# Patient Record
Sex: Male | Born: 1970 | Race: Black or African American | Hispanic: No | Marital: Single | Smoking: Current every day smoker
Health system: Southern US, Community
[De-identification: ages and names within clinical notes are randomized; demographics above are authoritative.]

## PROBLEM LIST (undated history)

## (undated) DIAGNOSIS — F1020 Alcohol dependence, uncomplicated: Secondary | ICD-10-CM

---

## 2003-02-14 ENCOUNTER — Emergency Department (HOSPITAL_COMMUNITY): Admission: EM | Admit: 2003-02-14 | Discharge: 2003-02-14 | Payer: Self-pay | Admitting: Emergency Medicine

## 2006-12-13 ENCOUNTER — Emergency Department (HOSPITAL_COMMUNITY): Admission: EM | Admit: 2006-12-13 | Discharge: 2006-12-13 | Payer: Self-pay | Admitting: Emergency Medicine

## 2008-12-11 ENCOUNTER — Emergency Department (HOSPITAL_COMMUNITY): Admission: EM | Admit: 2008-12-11 | Discharge: 2008-12-11 | Payer: Self-pay | Admitting: Emergency Medicine

## 2011-03-15 LAB — CULTURE, ROUTINE-ABSCESS

## 2012-01-09 ENCOUNTER — Encounter (HOSPITAL_COMMUNITY): Payer: Self-pay | Admitting: Emergency Medicine

## 2012-01-09 ENCOUNTER — Emergency Department (INDEPENDENT_AMBULATORY_CARE_PROVIDER_SITE_OTHER)
Admission: EM | Admit: 2012-01-09 | Discharge: 2012-01-09 | Disposition: A | Discharge status: Home / Self Care | Payer: Self-pay | Source: Home / Self Care

## 2012-01-09 DIAGNOSIS — IMO0001 Reserved for inherently not codable concepts without codable children: Secondary | ICD-10-CM

## 2012-01-09 DIAGNOSIS — M791 Myalgia, unspecified site: Secondary | ICD-10-CM

## 2012-01-09 MED ORDER — CYCLOBENZAPRINE HCL 5 MG PO TABS: 10.0000 mg | ORAL_TABLET | Freq: Three times a day (TID) | ORAL | Status: AC | PRN

## 2012-01-09 MED ORDER — NAPROXEN 500 MG PO TABS: 500.0000 mg | ORAL_TABLET | Freq: Two times a day (BID) | ORAL | Status: DC
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
## 2012-01-12 NOTE — ED Provider Notes (Signed)
Medical screening examination/treatment/procedure(s) were performed by non-physician practitioner and as supervising physician I was immediately available for consultation/collaboration.  Luiz Blare MD   Luiz Blare, MD 01/12/12 [phone]

## 2012-01-13 ENCOUNTER — Emergency Department (HOSPITAL_COMMUNITY)
Admission: EM | Admit: 2012-01-13 | Discharge: 2012-01-13 | Disposition: A | Discharge status: Home / Self Care | Payer: No Typology Code available for payment source | Attending: Emergency Medicine | Admitting: Emergency Medicine

## 2012-01-13 ENCOUNTER — Encounter (HOSPITAL_COMMUNITY): Payer: Self-pay | Admitting: Emergency Medicine

## 2012-01-13 DIAGNOSIS — F17200 Nicotine dependence, unspecified, uncomplicated: Secondary | ICD-10-CM | POA: Insufficient documentation

## 2012-01-13 DIAGNOSIS — K649 Unspecified hemorrhoids: Secondary | ICD-10-CM | POA: Insufficient documentation

## 2012-01-13 DIAGNOSIS — M549 Dorsalgia, unspecified: Secondary | ICD-10-CM | POA: Insufficient documentation

## 2012-01-13 MED ORDER — DIAZEPAM 5 MG PO TABS: 5.0000 mg | ORAL_TABLET | Freq: Three times a day (TID) | ORAL | Status: AC | PRN

## 2012-01-13 MED ORDER — HYDROCORTISONE 2.5 % RE CREA: TOPICAL_CREAM | RECTAL | Status: AC

## 2012-01-13 MED ORDER — NAPROXEN 500 MG PO TABS: 500.0000 mg | ORAL_TABLET | Freq: Two times a day (BID) | ORAL | Status: DC | PRN

## 2012-01-13 MED ORDER — DOCUSATE SODIUM 100 MG PO CAPS: 100.0000 mg | ORAL_CAPSULE | Freq: Two times a day (BID) | ORAL | Status: AC
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
## 2012-04-18 ENCOUNTER — Encounter (HOSPITAL_COMMUNITY): Payer: Self-pay | Admitting: Emergency Medicine

## 2012-04-18 ENCOUNTER — Emergency Department (HOSPITAL_COMMUNITY)
Admission: EM | Admit: 2012-04-18 | Discharge: 2012-04-19 | Disposition: A | Discharge status: Home / Self Care | Payer: Self-pay | Attending: Emergency Medicine | Admitting: Emergency Medicine

## 2012-04-18 DIAGNOSIS — K649 Unspecified hemorrhoids: Secondary | ICD-10-CM | POA: Insufficient documentation

## 2012-04-18 DIAGNOSIS — K6289 Other specified diseases of anus and rectum: Secondary | ICD-10-CM | POA: Insufficient documentation

## 2012-04-18 DIAGNOSIS — F17200 Nicotine dependence, unspecified, uncomplicated: Secondary | ICD-10-CM | POA: Insufficient documentation

## 2012-04-18 DIAGNOSIS — R300 Dysuria: Secondary | ICD-10-CM | POA: Insufficient documentation

## 2012-04-18 DIAGNOSIS — Z202 Contact with and (suspected) exposure to infections with a predominantly sexual mode of transmission: Secondary | ICD-10-CM

## 2012-04-18 DIAGNOSIS — R109 Unspecified abdominal pain: Secondary | ICD-10-CM | POA: Insufficient documentation

## 2012-04-18 NOTE — ED Notes (Signed)
Pt reports for a few weeks having groin pain and hemorrhoid pain; denies abnormal discharge

## 2012-04-19 LAB — URINALYSIS, ROUTINE W REFLEX MICROSCOPIC
Ketones, ur: NEGATIVE mg/dL
Leukocytes, UA: NEGATIVE
Nitrite: NEGATIVE
Protein, ur: NEGATIVE mg/dL
Urobilinogen, UA: 1 mg/dL (ref 0.0–1.0)
pH: 6 (ref 5.0–8.0)

## 2012-04-19 MED ORDER — CEFTRIAXONE SODIUM 250 MG IJ SOLR
250.0000 mg | Freq: Once | INTRAMUSCULAR | Status: AC
  Administered 2012-04-19: 250 mg via INTRAMUSCULAR
  Filled 2012-04-19: qty 250

## 2012-04-19 MED ORDER — AZITHROMYCIN 250 MG PO TABS
1000.0000 mg | ORAL_TABLET | Freq: Once | ORAL | Status: AC
  Administered 2012-04-19: 1000 mg via ORAL
  Filled 2012-04-19: qty 4

## 2012-04-19 MED ORDER — METRONIDAZOLE 500 MG PO TABS
2000.0000 mg | ORAL_TABLET | Freq: Once | ORAL | Status: AC
  Administered 2012-04-19: 2000 mg via ORAL
  Filled 2012-04-19: qty 4

## 2012-04-19 MED ORDER — LIDOCAINE HCL (PF) 1 % IJ SOLN
INTRAMUSCULAR | Status: AC
  Administered 2012-04-19: 0.9 mL
  Filled 2012-04-19: qty 5
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
## 2012-04-19 NOTE — ED Notes (Signed)
Patient is alert and orientedx4.  Patient was explained discharge instructions and he did not have any questions. 

## 2012-04-19 NOTE — ED Notes (Signed)
Patient is resting comfortably. 

## 2012-04-19 NOTE — ED Provider Notes (Signed)
Medical screening examination/treatment/procedure(s) were performed by non-physician practitioner and as supervising physician I was immediately available for consultation/collaboration.  Johncarlo Maalouf Lytle Michaels, MD 04/19/12 [phone]

## 2012-04-19 NOTE — ED Notes (Signed)
Patient says he was having the pain in his rectum and groin for two weeks and has been progressing.  Patient says he came in because the pain has gotten worse.

## 2012-04-19 NOTE — ED Notes (Signed)
I gave the patient a cup of ice water. 

## 2014-12-02 ENCOUNTER — Emergency Department (HOSPITAL_COMMUNITY): Payer: Self-pay

## 2014-12-02 ENCOUNTER — Encounter (HOSPITAL_COMMUNITY): Payer: Self-pay | Admitting: *Deleted

## 2014-12-02 ENCOUNTER — Emergency Department (HOSPITAL_COMMUNITY)
Admission: EM | Admit: 2014-12-02 | Discharge: 2014-12-02 | Disposition: A | Discharge status: Home / Self Care | Payer: Self-pay | Attending: Emergency Medicine | Admitting: Emergency Medicine

## 2014-12-02 DIAGNOSIS — W3400XA Accidental discharge from unspecified firearms or gun, initial encounter: Secondary | ICD-10-CM

## 2014-12-02 DIAGNOSIS — Y9289 Other specified places as the place of occurrence of the external cause: Secondary | ICD-10-CM | POA: Insufficient documentation

## 2014-12-02 DIAGNOSIS — Y998 Other external cause status: Secondary | ICD-10-CM | POA: Insufficient documentation

## 2014-12-02 DIAGNOSIS — Z720 Tobacco use: Secondary | ICD-10-CM | POA: Insufficient documentation

## 2014-12-02 DIAGNOSIS — Y9389 Activity, other specified: Secondary | ICD-10-CM | POA: Insufficient documentation

## 2014-12-02 DIAGNOSIS — S81802A Unspecified open wound, left lower leg, initial encounter: Secondary | ICD-10-CM | POA: Insufficient documentation

## 2014-12-02 LAB — COMPREHENSIVE METABOLIC PANEL
ALBUMIN: 3.7 g/dL (ref 3.5–5.0)
ALT: 87 U/L — AB (ref 17–63)
AST: 81 U/L — ABNORMAL HIGH (ref 15–41)
Alkaline Phosphatase: 56 U/L (ref 38–126)
Anion gap: 11 (ref 5–15)
BILIRUBIN TOTAL: 0.8 mg/dL (ref 0.3–1.2)
BUN: 8 mg/dL (ref 6–20)
CALCIUM: 8.6 mg/dL — AB (ref 8.9–10.3)
CO2: 26 mmol/L (ref 22–32)
Chloride: 105 mmol/L (ref 101–111)
Creatinine, Ser: 1.46 mg/dL — ABNORMAL HIGH (ref 0.61–1.24)
GFR calc Af Amer: >60 mL/min (ref >60)
GFR calc non Af Amer: 58 mL/min — ABNORMAL LOW (ref >60)
GLUCOSE: 113 mg/dL — AB (ref 65–99)
Potassium: 4.1 mmol/L (ref 3.5–5.1)
SODIUM: 142 mmol/L (ref 135–145)
Total Protein: 6.5 g/dL (ref 6.5–8.1)

## 2014-12-02 LAB — CBC
HEMATOCRIT: 38.9 % — AB (ref 39.0–52.0)
Hemoglobin: 13.8 g/dL (ref 13.0–17.0)
MCH: 36 pg — AB (ref 26.0–34.0)
MCHC: 35.5 g/dL (ref 30.0–36.0)
MCV: 101.6 fL — AB (ref 78.0–100.0)
Platelets: 202 10*3/uL (ref 150–400)
RBC: 3.83 MIL/uL — ABNORMAL LOW (ref 4.22–5.81)
RDW: 14.1 % (ref 11.5–15.5)
WBC: 4 10*3/uL (ref 4.0–10.5)

## 2014-12-02 LAB — ETHANOL: Alcohol, Ethyl (B): 224 mg/dL — ABNORMAL HIGH (ref <5)

## 2014-12-02 MED ORDER — SODIUM CHLORIDE 0.9 % IV BOLUS (SEPSIS)
1000.0000 mL | Freq: Once | INTRAVENOUS | Status: AC
  Administered 2014-12-02: 1000 mL via INTRAVENOUS

## 2014-12-02 MED ORDER — OXYCODONE-ACETAMINOPHEN 5-325 MG PO TABS
2.0000 | ORAL_TABLET | Freq: Once | ORAL | Status: AC
  Administered 2014-12-02: 2 via ORAL
  Filled 2014-12-02: qty 2

## 2014-12-02 MED ORDER — HYDROCODONE-ACETAMINOPHEN 5-325 MG PO TABS: 1.0000 | ORAL_TABLET | Freq: Four times a day (QID) | ORAL | Status: DC | PRN

## 2014-12-02 NOTE — ED Notes (Signed)
Pt off unit with xray 
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
## 2014-12-02 NOTE — ED Notes (Addendum)
Pt returned from xray, [employer] at bedside, pain medication given.  Pt belongings placed in a paperbag.

## 2014-12-02 NOTE — ED Notes (Signed)
CSI at bedside.

## 2014-12-02 NOTE — Care Management (Addendum)
ED CM spoke with patient at bedside regarding follow up care. Discussed the follow up care patient states, he does not have a PCP. [facility], patient is agreeable to establsihing care and [facility]. Follow up appt scheduled for 7/11 at 1230p  clinic information given to patient,and appt information placed on AVS. Updated J. Pieprenbink PA-C. No further ED CM needs identified.

## 2014-12-09 ENCOUNTER — Encounter: Payer: Self-pay | Admitting: Family Medicine

## 2014-12-09 ENCOUNTER — Ambulatory Visit: Payer: Self-pay | Attending: Family Medicine | Admitting: Family Medicine

## 2014-12-09 VITALS — BP 107/73 | HR 78 | Temp 98.6°F | Resp 18 | Ht 71.0 in | Wt 181.6 lb

## 2014-12-09 DIAGNOSIS — S81832A Puncture wound without foreign body, left lower leg, initial encounter: Secondary | ICD-10-CM | POA: Insufficient documentation

## 2014-12-09 DIAGNOSIS — W3400XA Accidental discharge from unspecified firearms or gun, initial encounter: Secondary | ICD-10-CM

## 2014-12-09 DIAGNOSIS — R748 Abnormal levels of other serum enzymes: Secondary | ICD-10-CM

## 2014-12-09 DIAGNOSIS — S81802A Unspecified open wound, left lower leg, initial encounter: Secondary | ICD-10-CM

## 2014-12-09 LAB — COMPREHENSIVE METABOLIC PANEL
ALBUMIN: 4 g/dL (ref 3.5–5.2)
ALK PHOS: 57 U/L (ref 39–117)
ALT: 82 U/L — AB (ref 0–53)
AST: 49 U/L — ABNORMAL HIGH (ref 0–37)
BILIRUBIN TOTAL: 0.9 mg/dL (ref 0.2–1.2)
BUN: 14 mg/dL (ref 6–23)
CHLORIDE: 103 meq/L (ref 96–112)
CO2: 27 meq/L (ref 19–32)
Calcium: 9.3 mg/dL (ref 8.4–10.5)
Creat: 1.17 mg/dL (ref 0.50–1.35)
GLUCOSE: 92 mg/dL (ref 70–99)
Potassium: 4.7 mEq/L (ref 3.5–5.3)
SODIUM: 139 meq/L (ref 135–145)
Total Protein: 6.9 g/dL (ref 6.0–8.3)
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
## 2014-12-10 ENCOUNTER — Telehealth: Payer: Self-pay | Admitting: *Deleted

## 2014-12-10 LAB — HEPATITIS PANEL, ACUTE
HCV AB: NEGATIVE
Hep A IgM: NONREACTIVE
Hep B C IgM: NONREACTIVE
Hepatitis B Surface Ag: NEGATIVE

## 2014-12-10 NOTE — Telephone Encounter (Signed)
-----   Message from Jaclyn ShaggyEnobong Amao, MD sent at 12/10/2014  8:19 AM EDT ----- LFTs are trending down, labs are negative for Hepatitis.

## 2014-12-10 NOTE — Telephone Encounter (Signed)
Left HIPAA compliant message for patient to return call. 

## 2014-12-17 ENCOUNTER — Ambulatory Visit: Payer: Self-pay | Attending: Family Medicine | Admitting: Family Medicine

## 2014-12-17 ENCOUNTER — Encounter: Payer: Self-pay | Admitting: Family Medicine

## 2014-12-17 VITALS — BP 123/81 | HR 75 | Temp 98.5°F | Resp 16 | Ht 71.0 in | Wt 181.0 lb

## 2014-12-17 DIAGNOSIS — Z720 Tobacco use: Secondary | ICD-10-CM | POA: Insufficient documentation

## 2014-12-17 DIAGNOSIS — S81802D Unspecified open wound, left lower leg, subsequent encounter: Secondary | ICD-10-CM | POA: Insufficient documentation

## 2014-12-17 DIAGNOSIS — R748 Abnormal levels of other serum enzymes: Secondary | ICD-10-CM | POA: Insufficient documentation

## 2014-12-17 DIAGNOSIS — W3400XD Accidental discharge from unspecified firearms or gun, subsequent encounter: Secondary | ICD-10-CM | POA: Insufficient documentation

## 2014-12-17 DIAGNOSIS — S81832D Puncture wound without foreign body, left lower leg, subsequent encounter: Secondary | ICD-10-CM
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
## 2014-12-17 NOTE — Patient Instructions (Signed)
Smoking Cessation Quitting smoking is important to your health and has many advantages. However, it is not always easy to quit since nicotine is a very addictive drug. Oftentimes, people try 3 times or more before being able to quit. This document explains the best ways for you to prepare to quit smoking. Quitting takes hard work and a lot of effort, but you can do it. ADVANTAGES OF QUITTING SMOKING  You will live longer, feel better, and live better.  Your body will feel the impact of quitting smoking almost immediately.  Within 20 minutes, blood pressure decreases. Your pulse returns to its normal level.  After 8 hours, carbon monoxide levels in the blood return to normal. Your oxygen level increases.  After 24 hours, the chance of having a heart attack starts to decrease. Your breath, hair, and body stop smelling like smoke.  After 48 hours, damaged nerve endings begin to recover. Your sense of taste and smell improve.  After 72 hours, the body is virtually free of nicotine. Your bronchial tubes relax and breathing becomes easier.  After 2 to 12 weeks, lungs can hold more air. Exercise becomes easier and circulation improves.  The risk of having a heart attack, stroke, cancer, or lung disease is greatly reduced.  After 1 year, the risk of coronary heart disease is cut in half.  After 5 years, the risk of stroke falls to the same as a nonsmoker.  After 10 years, the risk of lung cancer is cut in half and the risk of other cancers decreases significantly.  After 15 years, the risk of coronary heart disease drops, usually to the level of a nonsmoker.  If you are pregnant, quitting smoking will improve your chances of having a healthy baby.  The people you live with, especially any children, will be healthier.  You will have extra money to spend on things other than cigarettes. QUESTIONS TO THINK ABOUT BEFORE ATTEMPTING TO QUIT You may want to talk about your answers with your  health care provider.  Why do you want to quit?  If you tried to quit in the past, what helped and what did not?  What will be the most difficult situations for you after you quit? How will you plan to handle them?  Who can help you through the tough times? Your family? Friends? A health care provider?  What pleasures do you get from smoking? What ways can you still get pleasure if you quit? Here are some questions to ask your health care provider:  How can you help me to be successful at quitting?  What medicine do you think would be best for me and how should I take it?  What should I do if I need more help?  What is smoking withdrawal like? How can I get information on withdrawal? GET READY  Set a quit date.  Change your environment by getting rid of all cigarettes, ashtrays, matches, and lighters in your home, car, or work. Do not let people smoke in your home.  Review your past attempts to quit. Think about what worked and what did not. GET SUPPORT AND ENCOURAGEMENT You have a better chance of being successful if you have help. You can get support in many ways.  Tell your family, friends, and coworkers that you are going to quit and need their support. Ask them not to smoke around you.  Get individual, group, or telephone counseling and support. Programs are available at local hospitals and health centers. Call   your local health department for information about programs in your area.  Spiritual beliefs and practices may help some smokers quit.  Download a "quit meter" on your computer to keep track of quit statistics, such as how long you have gone without smoking, cigarettes not smoked, and money saved.  Get a self-help book about quitting smoking and staying off tobacco. LEARN NEW SKILLS AND BEHAVIORS  Distract yourself from urges to smoke. Talk to someone, go for a walk, or occupy your time with a task.  Change your normal routine. Take a different route to work.  Drink tea instead of coffee. Eat breakfast in a different place.  Reduce your stress. Take a hot bath, exercise, or read a book.  Plan something enjoyable to do every day. Reward yourself for not smoking.  Explore interactive web-based programs that specialize in helping you quit. GET MEDICINE AND USE IT CORRECTLY Medicines can help you stop smoking and decrease the urge to smoke. Combining medicine with the above behavioral methods and support can greatly increase your chances of successfully quitting smoking.  Nicotine replacement therapy helps deliver nicotine to your body without the negative effects and risks of smoking. Nicotine replacement therapy includes nicotine gum, lozenges, inhalers, nasal sprays, and skin patches. Some may be available over-the-counter and others require a prescription.  Antidepressant medicine helps people abstain from smoking, but how this works is unknown. This medicine is available by prescription.  Nicotinic receptor partial agonist medicine simulates the effect of nicotine in your brain. This medicine is available by prescription. Ask your health care provider for advice about which medicines to use and how to use them based on your health history. Your health care provider will tell you what side effects to look out for if you choose to be on a medicine or therapy. Carefully read the information on the package. Do not use any other product containing nicotine while using a nicotine replacement product.  RELAPSE OR DIFFICULT SITUATIONS Most relapses occur within the first 3 months after quitting. Do not be discouraged if you start smoking again. Remember, most people try several times before finally quitting. You may have symptoms of withdrawal because your body is used to nicotine. You may crave cigarettes, be irritable, feel very hungry, cough often, get headaches, or have difficulty concentrating. The withdrawal symptoms are only temporary. They are strongest  when you first quit, but they will go away within 10-14 days. To reduce the chances of relapse, try to:  Avoid drinking alcohol. Drinking lowers your chances of successfully quitting.  Reduce the amount of caffeine you consume. Once you quit smoking, the amount of caffeine in your body increases and can give you symptoms, such as a rapid heartbeat, sweating, and anxiety.  Avoid smokers because they can make you want to smoke.  Do not let weight gain distract you. Many smokers will gain weight when they quit, usually less than 10 pounds. Eat a healthy diet and stay active. You can always lose the weight gained after you quit.  Find ways to improve your mood other than smoking. FOR MORE INFORMATION  www.smokefree.gov  Document Released: 05/11/2001 Document Revised: 10/01/2013 Document Reviewed: 08/26/2011 ExitCare Patient Information 2015 [employer]. This information is not intended to replace advice given to you by your health care provider. Make sure you discuss any questions you have with your health care provider.  

## 2015-01-20 ENCOUNTER — Ambulatory Visit: Payer: Self-pay | Admitting: Family Medicine

## 2015-02-12 ENCOUNTER — Ambulatory Visit: Payer: Self-pay | Admitting: Family Medicine
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
## 2017-02-11 ENCOUNTER — Encounter (HOSPITAL_COMMUNITY): Payer: Self-pay | Admitting: Emergency Medicine

## 2017-02-11 ENCOUNTER — Emergency Department (HOSPITAL_COMMUNITY): Payer: Self-pay

## 2017-02-11 ENCOUNTER — Emergency Department (HOSPITAL_COMMUNITY)
Admission: EM | Admit: 2017-02-11 | Discharge: 2017-02-11 | Disposition: A | Discharge status: Home / Self Care | Payer: Self-pay | Attending: Emergency Medicine | Admitting: Emergency Medicine

## 2017-02-11 DIAGNOSIS — R109 Unspecified abdominal pain: Secondary | ICD-10-CM | POA: Insufficient documentation

## 2017-02-11 DIAGNOSIS — F17210 Nicotine dependence, cigarettes, uncomplicated: Secondary | ICD-10-CM | POA: Insufficient documentation

## 2017-02-11 DIAGNOSIS — M545 Low back pain, unspecified: Secondary | ICD-10-CM

## 2017-02-11 DIAGNOSIS — R748 Abnormal levels of other serum enzymes: Secondary | ICD-10-CM

## 2017-02-11 LAB — URINALYSIS, ROUTINE W REFLEX MICROSCOPIC
Bilirubin Urine: NEGATIVE
Glucose, UA: NEGATIVE mg/dL
Ketones, ur: NEGATIVE mg/dL
Leukocytes, UA: NEGATIVE
Nitrite: NEGATIVE
PROTEIN: NEGATIVE mg/dL
RBC / HPF: NONE SEEN RBC/hpf (ref 0–5)
SQUAMOUS EPITHELIAL / LPF: NONE SEEN
Specific Gravity, Urine: 1.011 (ref 1.005–1.030)
pH: 5 (ref 5.0–8.0)

## 2017-02-11 LAB — CBC
HEMATOCRIT: 41.5 % (ref 39.0–52.0)
Hemoglobin: 14.5 g/dL (ref 13.0–17.0)
MCH: 34.7 pg — ABNORMAL HIGH (ref 26.0–34.0)
MCHC: 34.9 g/dL (ref 30.0–36.0)
MCV: 99.3 fL (ref 78.0–100.0)
PLATELETS: 159 10*3/uL (ref 150–400)
RBC: 4.18 MIL/uL — ABNORMAL LOW (ref 4.22–5.81)
RDW: 14.3 % (ref 11.5–15.5)
WBC: 3.5 10*3/uL — AB (ref 4.0–10.5)

## 2017-02-11 LAB — BASIC METABOLIC PANEL
Anion gap: 9 (ref 5–15)
BUN: 8 mg/dL (ref 6–20)
CALCIUM: 8.6 mg/dL — AB (ref 8.9–10.3)
CO2: 22 mmol/L (ref 22–32)
Chloride: 106 mmol/L (ref 101–111)
Creatinine, Ser: 1.03 mg/dL (ref 0.61–1.24)
GFR calc Af Amer: >60 mL/min (ref >60)
Glucose, Bld: 103 mg/dL — ABNORMAL HIGH (ref 65–99)
POTASSIUM: 3.8 mmol/L (ref 3.5–5.1)
SODIUM: 137 mmol/L (ref 135–145)

## 2017-02-11 MED ORDER — METHOCARBAMOL 500 MG PO TABS: 500.0000 mg | ORAL_TABLET | Freq: Two times a day (BID) | ORAL | 0 refills | Status: DC | PRN
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
## 2017-02-11 NOTE — ED Notes (Signed)
Patient inquired about where the cafeteria was located, this NT instructed patient that our recommendation is that patients do not eat or drink until they see the doctor. Patient left waiting area to go to the cafeteria.

## 2017-02-11 NOTE — ED Notes (Signed)
ED Provider at bedside. 

## 2017-02-11 NOTE — ED Notes (Signed)
Patient transported to CT 

## 2017-02-11 NOTE — Discharge Instructions (Addendum)
Your kidney tests today are not normal. You need to drink plenty of water and have this rechecked by your primary care doctor in the next week. Do not take any NSAIDs (motrin, ibuprofen, advil, aleve, excedrin, aspirin, naproxen, goody's powder,  etc.) because they can further hurt your kidneys.   You can also take  tylenol (acetaminophen)  (this is 3 over the counter pills) four times a day. Do not drink alcohol or combine with other medications that have acetaminophen as an ingredient (Read the labels!).    For breakthrough pain you may take Robaxin. Do not drink alcohol, drive or operate heavy machinery when taking Robaxin.   Do not hesitate to return to the Emergency Department for any new, worsening or concerning symptoms.   If you do not have a primary care doctor you can establish one at the   [facility]: [address] [place] [address] [phone]  After you establish care. Let them know you were seen in the emergency room. They must obtain records for further management.

## 2017-03-22 ENCOUNTER — Encounter (HOSPITAL_COMMUNITY): Payer: Self-pay

## 2017-03-22 ENCOUNTER — Emergency Department (HOSPITAL_COMMUNITY)
Admission: EM | Admit: 2017-03-22 | Discharge: 2017-03-23 | Disposition: A | Discharge status: Home / Self Care | Payer: BLUE CROSS/BLUE SHIELD | Attending: Emergency Medicine | Admitting: Emergency Medicine

## 2017-03-22 DIAGNOSIS — F17210 Nicotine dependence, cigarettes, uncomplicated: Secondary | ICD-10-CM | POA: Diagnosis not present

## 2017-03-22 DIAGNOSIS — G8929 Other chronic pain: Secondary | ICD-10-CM | POA: Diagnosis not present

## 2017-03-22 DIAGNOSIS — M545 Low back pain, unspecified: Secondary | ICD-10-CM

## 2017-03-22 DIAGNOSIS — M549 Dorsalgia, unspecified: Secondary | ICD-10-CM | POA: Insufficient documentation

## 2017-03-22 NOTE — ED Triage Notes (Signed)
Pt endorses bilateral flank pain and lower back pain x 2-3 months since starting a new job. Pt denies urinary sx or neuro sx. VSS. Ambulatory.

## 2017-03-23 NOTE — Discharge Instructions (Signed)
Use Tylenol twice a day. Use the muscle relaxer at night. You may pick up a back brace from a medical supply store to help support your back. Make sure that you are bending at the knees, and not at the waist. Follow-up with [facility] wellness for further evaluation of your back and management of your kidney health. Return to the emergency room if you develop loss of bowel or bladder control, numbness, blood in your urine, or any new or worsening symptoms.
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
## 2017-03-26 ENCOUNTER — Emergency Department (HOSPITAL_COMMUNITY): Payer: BLUE CROSS/BLUE SHIELD

## 2017-03-26 ENCOUNTER — Emergency Department (HOSPITAL_COMMUNITY)
Admission: EM | Admit: 2017-03-26 | Discharge: 2017-03-26 | Disposition: A | Discharge status: Home / Self Care | Payer: BLUE CROSS/BLUE SHIELD | Attending: Emergency Medicine | Admitting: Emergency Medicine

## 2017-03-26 ENCOUNTER — Encounter (HOSPITAL_COMMUNITY): Payer: Self-pay | Admitting: Emergency Medicine

## 2017-03-26 DIAGNOSIS — R1013 Epigastric pain: Secondary | ICD-10-CM | POA: Diagnosis present

## 2017-03-26 DIAGNOSIS — Z79899 Other long term (current) drug therapy: Secondary | ICD-10-CM | POA: Insufficient documentation

## 2017-03-26 DIAGNOSIS — R1011 Right upper quadrant pain: Secondary | ICD-10-CM

## 2017-03-26 DIAGNOSIS — K859 Acute pancreatitis without necrosis or infection, unspecified: Secondary | ICD-10-CM

## 2017-03-26 DIAGNOSIS — F17210 Nicotine dependence, cigarettes, uncomplicated: Secondary | ICD-10-CM | POA: Diagnosis not present

## 2017-03-26 DIAGNOSIS — R0602 Shortness of breath: Secondary | ICD-10-CM | POA: Diagnosis not present

## 2017-03-26 LAB — COMPREHENSIVE METABOLIC PANEL
ALBUMIN: 4.2 g/dL (ref 3.5–5.0)
ALT: 214 U/L — ABNORMAL HIGH (ref 17–63)
ANION GAP: 14 (ref 5–15)
AST: 272 U/L — ABNORMAL HIGH (ref 15–41)
Alkaline Phosphatase: 73 U/L (ref 38–126)
BILIRUBIN TOTAL: 0.7 mg/dL (ref 0.3–1.2)
BUN: 7 mg/dL (ref 6–20)
CO2: 23 mmol/L (ref 22–32)
Calcium: 8.6 mg/dL — ABNORMAL LOW (ref 8.9–10.3)
Chloride: 98 mmol/L — ABNORMAL LOW (ref 101–111)
Creatinine, Ser: 0.93 mg/dL (ref 0.61–1.24)
GFR calc Af Amer: >60 mL/min (ref >60)
GLUCOSE: 93 mg/dL (ref 65–99)
POTASSIUM: 4 mmol/L (ref 3.5–5.1)
Sodium: 135 mmol/L (ref 135–145)
TOTAL PROTEIN: 7.8 g/dL (ref 6.5–8.1)

## 2017-03-26 LAB — URINALYSIS, ROUTINE W REFLEX MICROSCOPIC
BACTERIA UA: NONE SEEN
BILIRUBIN URINE: NEGATIVE
Glucose, UA: NEGATIVE mg/dL
Ketones, ur: NEGATIVE mg/dL
LEUKOCYTES UA: NEGATIVE
NITRITE: NEGATIVE
Protein, ur: NEGATIVE mg/dL
Specific Gravity, Urine: 1.008 (ref 1.005–1.030)
pH: 5 (ref 5.0–8.0)

## 2017-03-26 LAB — CBC
HEMATOCRIT: 41.6 % (ref 39.0–52.0)
HEMOGLOBIN: 15.2 g/dL (ref 13.0–17.0)
MCH: 35.6 pg — ABNORMAL HIGH (ref 26.0–34.0)
MCHC: 36.5 g/dL — ABNORMAL HIGH (ref 30.0–36.0)
MCV: 97.4 fL (ref 78.0–100.0)
Platelets: 148 10*3/uL — ABNORMAL LOW (ref 150–400)
RBC: 4.27 MIL/uL (ref 4.22–5.81)
RDW: 14.9 % (ref 11.5–15.5)
WBC: 4.7 10*3/uL (ref 4.0–10.5)

## 2017-03-26 LAB — LIPASE, BLOOD: LIPASE: 361 U/L — AB (ref 11–51)

## 2017-03-26 MED ORDER — MORPHINE SULFATE 15 MG PO TABS: 15.0000 mg | ORAL_TABLET | ORAL | 0 refills | Status: DC | PRN

## 2017-03-26 MED ORDER — MORPHINE SULFATE (PF) 4 MG/ML IV SOLN
4.0000 mg | Freq: Once | INTRAVENOUS | Status: AC
  Administered 2017-03-26: 4 mg via INTRAVENOUS
  Filled 2017-03-26: qty 1

## 2017-03-26 MED ORDER — ONDANSETRON HCL 4 MG/2ML IJ SOLN
4.0000 mg | Freq: Once | INTRAMUSCULAR | Status: AC
  Administered 2017-03-26: 4 mg via INTRAVENOUS
  Filled 2017-03-26: qty 2

## 2017-03-26 MED ORDER — ONDANSETRON 4 MG PO TBDP: 4.0000 mg | ORAL_TABLET | Freq: Three times a day (TID) | ORAL | 0 refills | Status: DC | PRN

## 2017-03-26 NOTE — Discharge Instructions (Signed)
Clear liquid diet for the next couple of days.

## 2017-03-26 NOTE — ED Notes (Signed)
Patient transported to Ultrasound 

## 2017-03-26 NOTE — ED Triage Notes (Addendum)
C/o intermittent generalized abd pain and sob x 4-5 hours.  Denies nausea, vomiting, diarrhea, and urinary complaint.  Pt states he has drank about 9 cans of beer since yesterday that was expired (2016).
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
## 2017-04-29 ENCOUNTER — Encounter (HOSPITAL_COMMUNITY): Payer: Self-pay | Admitting: Emergency Medicine

## 2017-04-29 ENCOUNTER — Emergency Department (HOSPITAL_COMMUNITY)
Admission: EM | Admit: 2017-04-29 | Discharge: 2017-04-29 | Disposition: A | Discharge status: Home / Self Care | Payer: Self-pay | Attending: Emergency Medicine | Admitting: Emergency Medicine

## 2017-04-29 ENCOUNTER — Emergency Department (HOSPITAL_COMMUNITY): Payer: Self-pay

## 2017-04-29 DIAGNOSIS — S0181XA Laceration without foreign body of other part of head, initial encounter: Secondary | ICD-10-CM | POA: Insufficient documentation

## 2017-04-29 DIAGNOSIS — Z79899 Other long term (current) drug therapy: Secondary | ICD-10-CM | POA: Insufficient documentation

## 2017-04-29 DIAGNOSIS — S0990XA Unspecified injury of head, initial encounter: Secondary | ICD-10-CM | POA: Insufficient documentation

## 2017-04-29 DIAGNOSIS — Y999 Unspecified external cause status: Secondary | ICD-10-CM | POA: Insufficient documentation

## 2017-04-29 DIAGNOSIS — Y929 Unspecified place or not applicable: Secondary | ICD-10-CM | POA: Insufficient documentation

## 2017-04-29 DIAGNOSIS — W19XXXA Unspecified fall, initial encounter: Secondary | ICD-10-CM | POA: Insufficient documentation

## 2017-04-29 DIAGNOSIS — F17210 Nicotine dependence, cigarettes, uncomplicated: Secondary | ICD-10-CM | POA: Insufficient documentation

## 2017-04-29 DIAGNOSIS — Y939 Activity, unspecified: Secondary | ICD-10-CM | POA: Insufficient documentation

## 2017-04-29 HISTORY — DX: Alcohol dependence, uncomplicated: F10.20

## 2017-04-29 MED ORDER — LIDOCAINE-EPINEPHRINE (PF) 2 %-1:200000 IJ SOLN
10.0000 mL | Freq: Once | INTRAMUSCULAR | Status: AC
  Administered 2017-04-29: 10 mL
  Filled 2017-04-29: qty 20

## 2017-04-29 NOTE — ED Notes (Signed)
PA at bedside suturing laceration.  
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
## 2017-04-29 NOTE — ED Triage Notes (Signed)
Patient presents with approx. 1" laceration at right eyelid sustained last night , pt. stated alcohol intoxication and fell , can not recall if he lost consciousness /ambulatory . Alert and oriented .

## 2017-04-29 NOTE — ED Notes (Signed)
Patient transported to CT 

## 2017-04-29 NOTE — Discharge Instructions (Signed)
Please read and follow all provided instructions.  Your diagnoses today include:  1. Mild closed head injury, initial encounter   2. Facial laceration, initial encounter     Tests performed today include:  CT of your brain - no bruising or bleeding in the brain  Vital signs. See below for your results today.   Medications prescribed:   None  Take any prescribed medications only as directed.   Home care instructions:  Follow any educational materials and wound care instructions contained in this packet.   Keep affected area above the level of your heart when possible to minimize swelling. Wash area gently twice a day with warm soapy water. Do not apply alcohol or hydrogen peroxide. Cover the area if it draining or weeping.   Follow-up instructions: Suture Removal: Return to the Emergency Department or see your primary care care doctor in 5 days for a recheck of your wound and removal of your sutures or staples.    Return instructions:  Return to the Emergency Department if you have:  Fever  Worsening pain  Worsening swelling of the wound  Pus draining from the wound  Redness of the skin that moves away from the wound, especially if it streaks away from the affected area   Any other emergent concerns  Your vital signs today were: BP (!) 144/90    Pulse 77    Temp 97.6 F (36.4 C) (Oral)    Resp 16    SpO2 97%  If your blood pressure (BP) was elevated above 135/85 this visit, please have this repeated by your doctor within one month. --------------

## 2018-06-29 IMAGING — US US ABDOMEN LIMITED
1 series · 14 of 25 positions shown · non-contrast
Comparison: CT of the abdomen and pelvis performed 02/11/2017

CLINICAL DATA: Acute onset of right upper quadrant abdominal pain.
Elevated lipase and LFTs. Initial encounter.

EXAM:
ULTRASOUND ABDOMEN LIMITED RIGHT UPPER QUADRANT

[Series 1: us abdomen limited · 0.25mm/px · 14 of 47 slices shown]
[im 1/47]
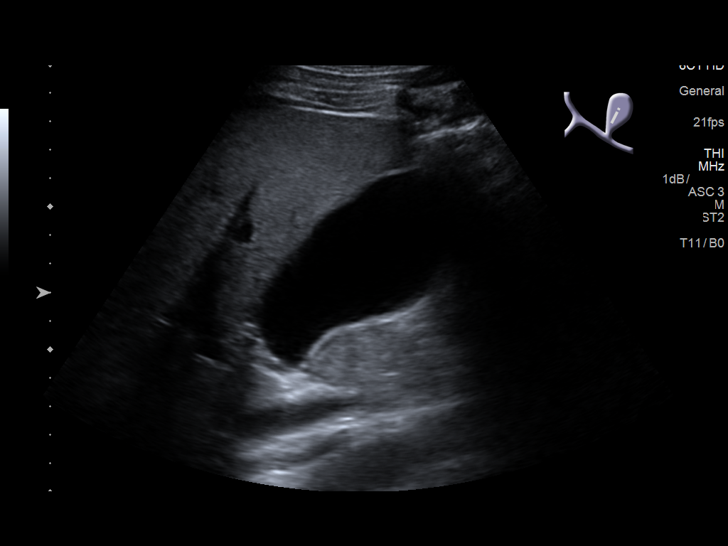
[im 4/47]
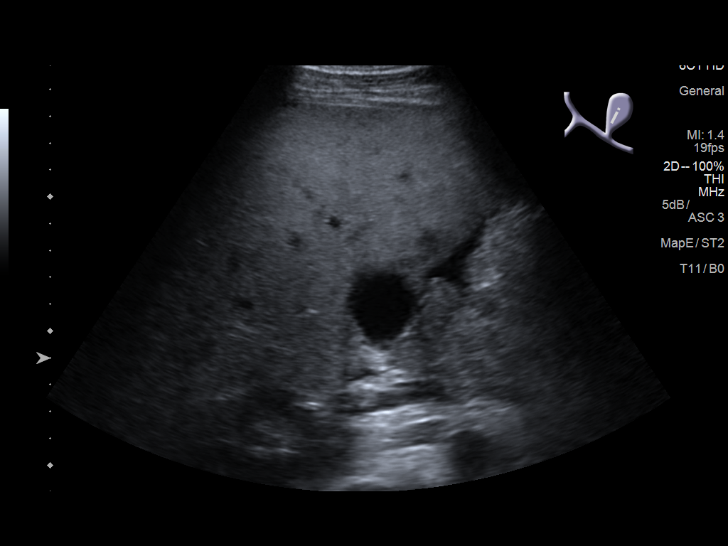
[im 8/47]
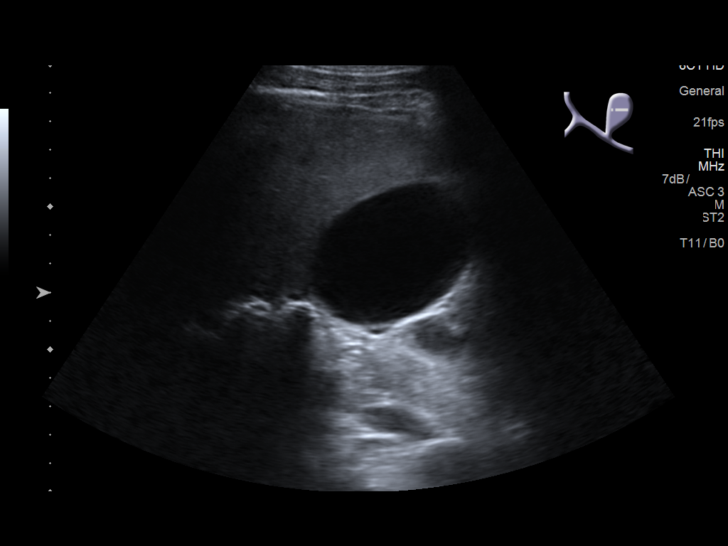
[im 12/47]
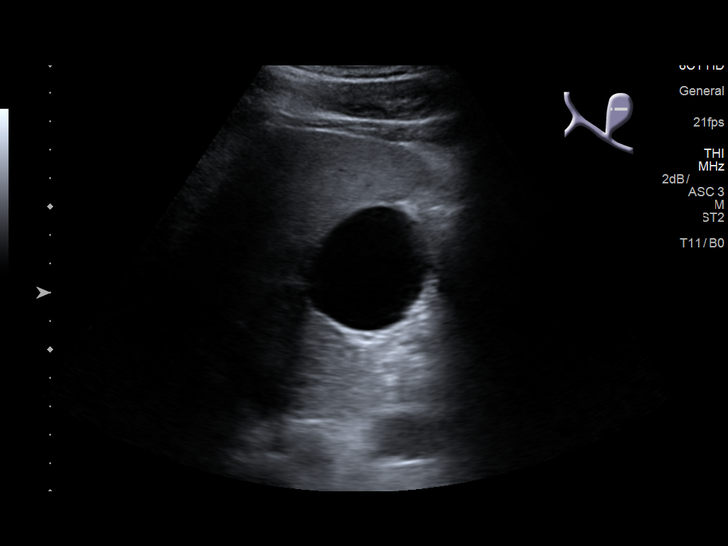
[im 16/47]
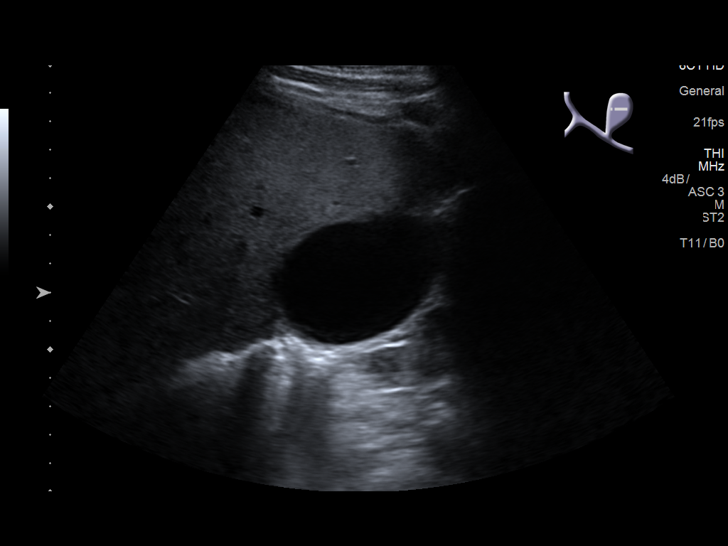
[im 18/47]
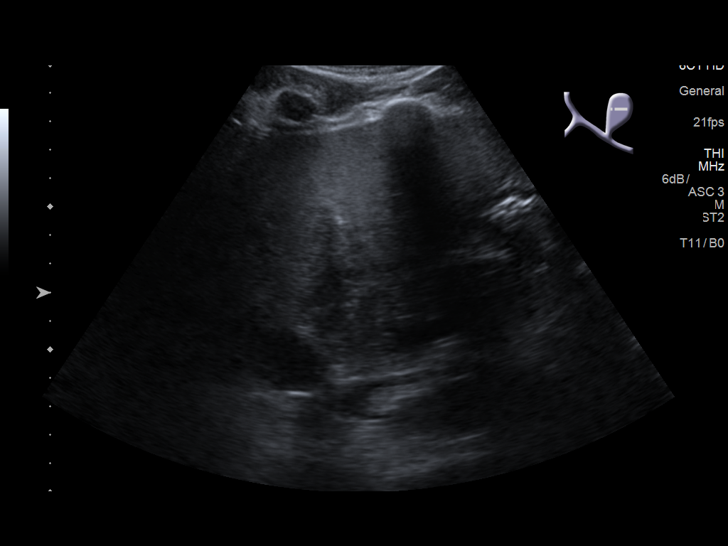
[im 22/47]
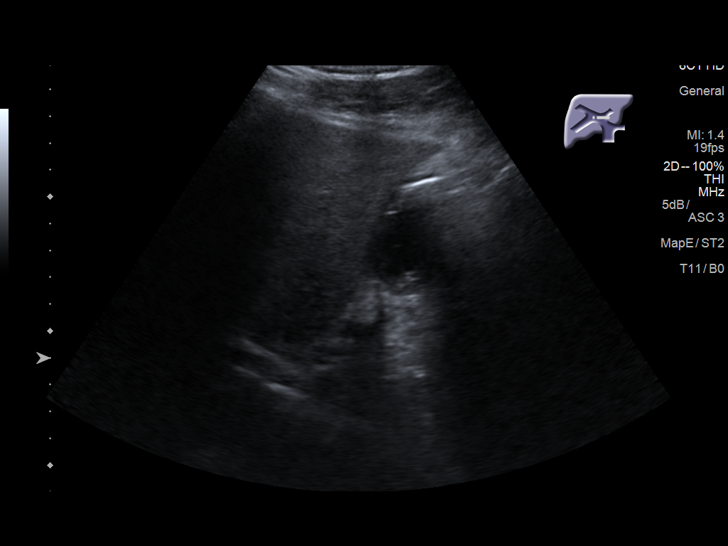
[im 25/47]
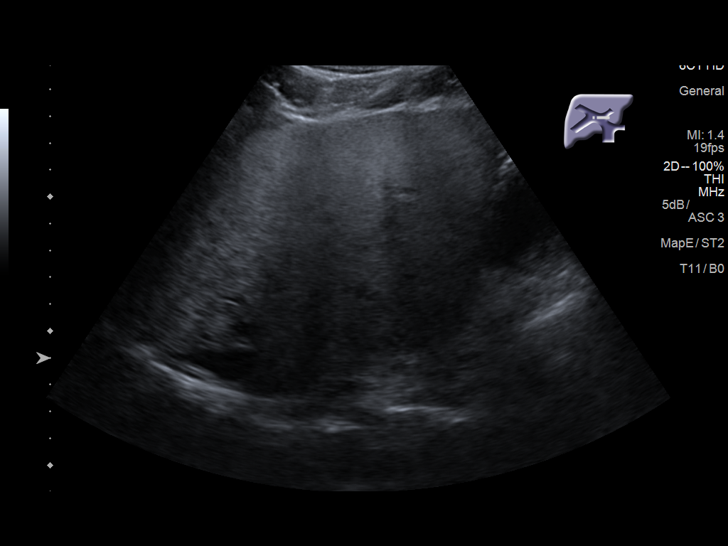
[im 29/47]
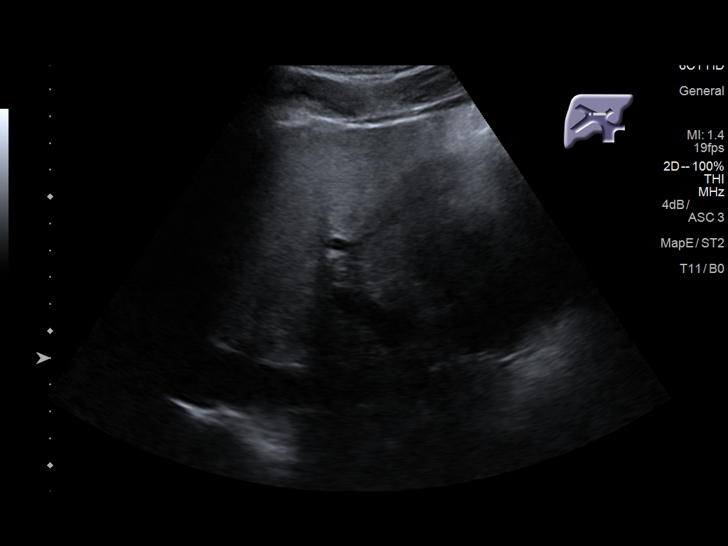
[im 31/47]
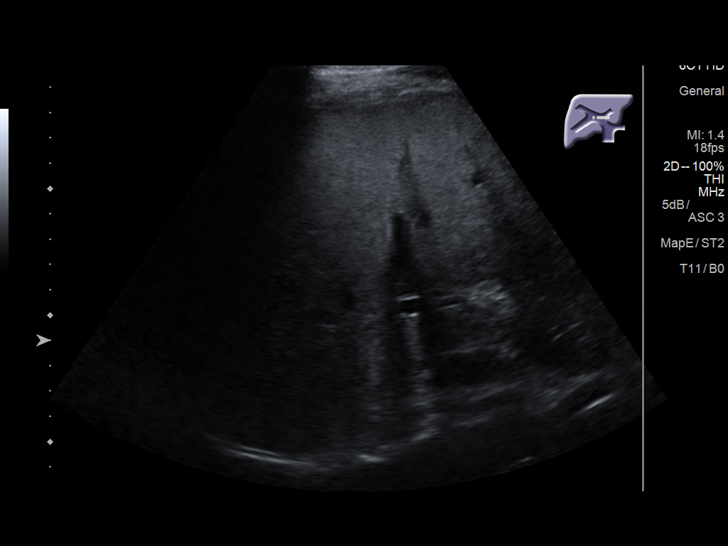
[im 35/47]
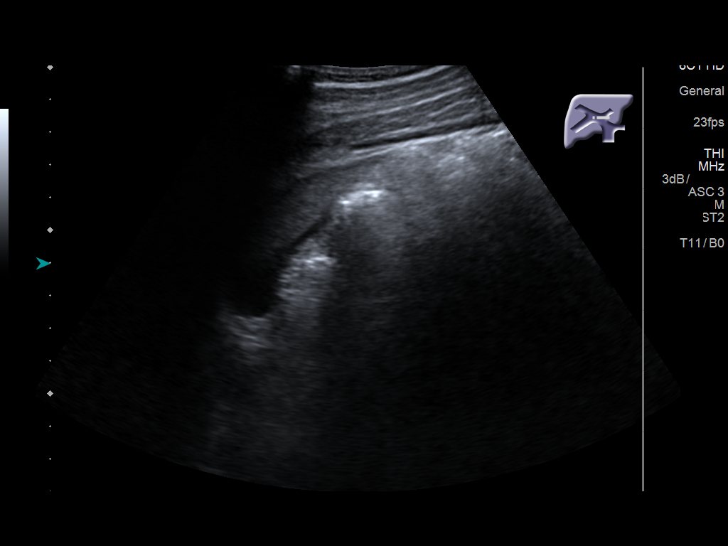
[im 39/47]
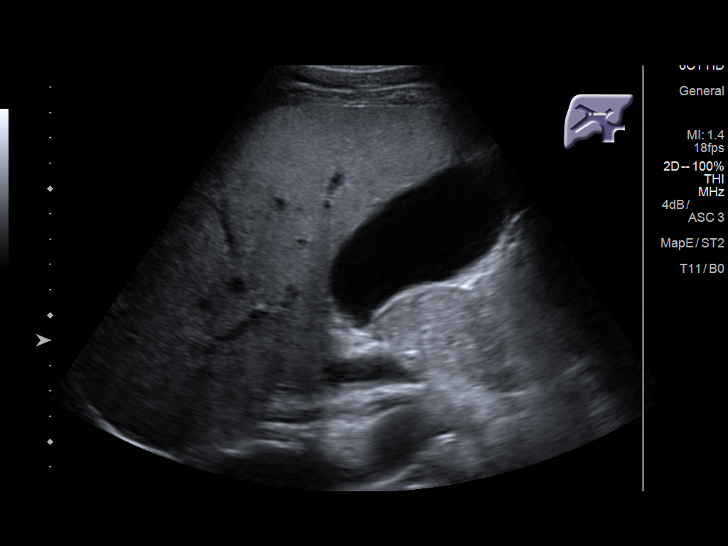
[im 43/47]
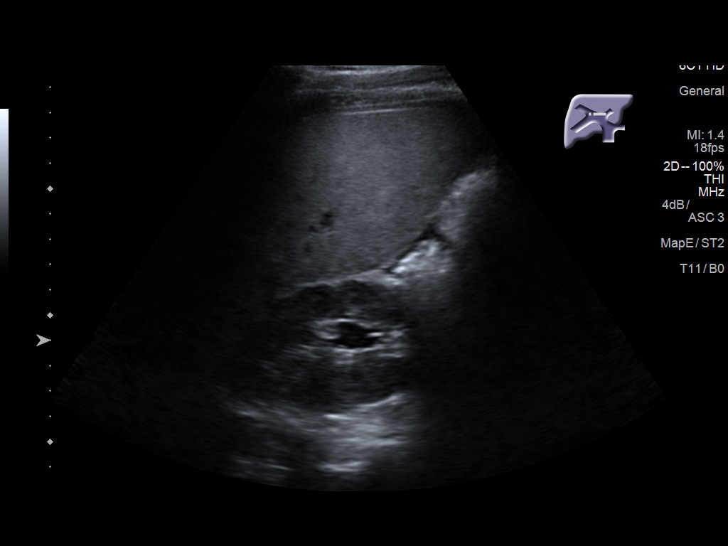
[im 47/47]
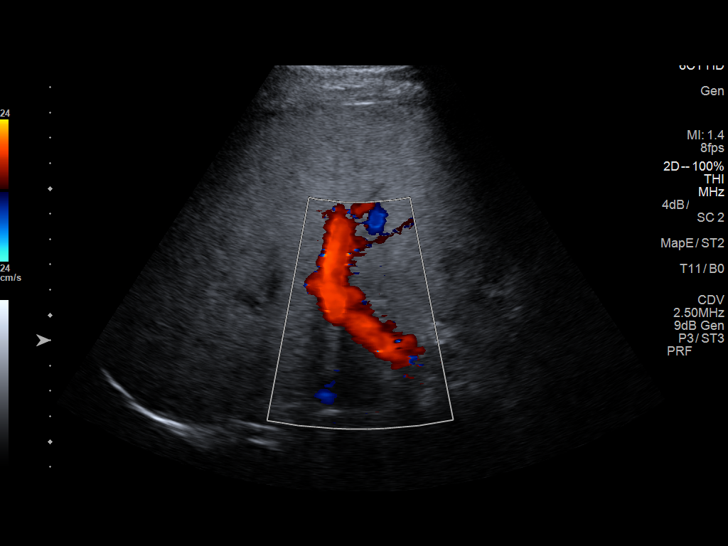

[14 of 25 positions shown; findings below may reference images not displayed]

FINDINGS: Gallbladder:

No gallstones or wall thickening visualized. Minimal echogenic
sludge is noted within the gallbladder. No sonographic Murphy sign
noted by sonographer.

Common bile duct:

Diameter: 0.3 cm, within normal limits in caliber.

Liver:

No focal lesion identified. Diffusely increased parenchymal
echogenicity and coarsened echotexture, compatible with fatty
infiltration. Portal vein is patent on color Doppler imaging with
normal direction of blood flow towards the liver.
IMPRESSION: 1. No acute abnormality seen at the right upper quadrant.
2. Minimal echogenic sludge noted within the gallbladder.
Gallbladder otherwise unremarkable in appearance.
3. Diffuse fatty infiltration within the liver.

## 2019-12-11 ENCOUNTER — Other Ambulatory Visit: Payer: Self-pay

## 2019-12-11 ENCOUNTER — Emergency Department (HOSPITAL_COMMUNITY): Payer: Self-pay

## 2019-12-11 ENCOUNTER — Emergency Department (HOSPITAL_COMMUNITY)
Admission: EM | Admit: 2019-12-11 | Discharge: 2019-12-12 | Discharge status: Against Medical Advice | Payer: Self-pay | Attending: Emergency Medicine | Admitting: Emergency Medicine

## 2019-12-11 ENCOUNTER — Encounter (HOSPITAL_COMMUNITY): Payer: Self-pay

## 2019-12-11 DIAGNOSIS — R109 Unspecified abdominal pain: Secondary | ICD-10-CM

## 2019-12-11 DIAGNOSIS — R1011 Right upper quadrant pain: Secondary | ICD-10-CM | POA: Insufficient documentation

## 2019-12-11 DIAGNOSIS — F17210 Nicotine dependence, cigarettes, uncomplicated: Secondary | ICD-10-CM | POA: Insufficient documentation

## 2019-12-11 LAB — BASIC METABOLIC PANEL
Anion gap: 9 (ref 5–15)
BUN: 12 mg/dL (ref 6–20)
CO2: 24 mmol/L (ref 22–32)
Calcium: 9.1 mg/dL (ref 8.9–10.3)
Chloride: 105 mmol/L (ref 98–111)
Creatinine, Ser: 0.99 mg/dL (ref 0.61–1.24)
GFR calc Af Amer: >60 mL/min (ref >60)
GFR calc non Af Amer: >60 mL/min (ref >60)
Glucose, Bld: 95 mg/dL (ref 70–99)
Potassium: 4 mmol/L (ref 3.5–5.1)
Sodium: 138 mmol/L (ref 135–145)

## 2019-12-11 LAB — CBC
HCT: 42 % (ref 39.0–52.0)
Hemoglobin: 14.3 g/dL (ref 13.0–17.0)
MCH: 35.6 pg — ABNORMAL HIGH (ref 26.0–34.0)
MCHC: 34 g/dL (ref 30.0–36.0)
MCV: 104.5 fL — ABNORMAL HIGH (ref 80.0–100.0)
Platelets: 212 10*3/uL (ref 150–400)
RBC: 4.02 MIL/uL — ABNORMAL LOW (ref 4.22–5.81)
RDW: 14 % (ref 11.5–15.5)
WBC: 5.1 10*3/uL (ref 4.0–10.5)
nRBC: 0 % (ref 0.0–0.2)

## 2019-12-11 LAB — TROPONIN I (HIGH SENSITIVITY): Troponin I (High Sensitivity): 4 ng/L (ref <18)

## 2019-12-11 MED ORDER — SODIUM CHLORIDE 0.9% FLUSH: 3.0000 mL | Freq: Once | INTRAVENOUS | Status: DC

## 2019-12-11 NOTE — ED Triage Notes (Signed)
Pt reports pain to right side ribcage that radiates around to back. Denies injury. Endorses pain with taking deep breath. Denies cough.

## 2019-12-11 NOTE — ED Notes (Signed)
Pt found in the lobby sleeping

## 2019-12-11 NOTE — ED Notes (Signed)
Mother (sally) would like to be notified once pt is back in room

## 2019-12-11 NOTE — ED Notes (Signed)
No answer for VS x1 

## 2019-12-11 NOTE — ED Notes (Signed)
No answer for VS x 2 

## 2019-12-12 ENCOUNTER — Emergency Department (HOSPITAL_COMMUNITY): Payer: Self-pay

## 2019-12-12 LAB — HEPATIC FUNCTION PANEL
ALT: 48 U/L — ABNORMAL HIGH (ref 0–44)
AST: 51 U/L — ABNORMAL HIGH (ref 15–41)
Albumin: 3.4 g/dL — ABNORMAL LOW (ref 3.5–5.0)
Alkaline Phosphatase: 53 U/L (ref 38–126)
Bilirubin, Direct: 0.5 mg/dL — ABNORMAL HIGH (ref 0.0–0.2)
Indirect Bilirubin: 0.9 mg/dL (ref 0.3–0.9)
Total Bilirubin: 1.4 mg/dL — ABNORMAL HIGH (ref 0.3–1.2)
Total Protein: 6.2 g/dL — ABNORMAL LOW (ref 6.5–8.1)

## 2019-12-12 LAB — URINALYSIS, ROUTINE W REFLEX MICROSCOPIC
Bilirubin Urine: NEGATIVE
Glucose, UA: NEGATIVE mg/dL
Hgb urine dipstick: NEGATIVE
Ketones, ur: NEGATIVE mg/dL
Leukocytes,Ua: NEGATIVE
Nitrite: NEGATIVE
Protein, ur: NEGATIVE mg/dL
Specific Gravity, Urine: 1.029 (ref 1.005–1.030)
pH: 6 (ref 5.0–8.0)

## 2019-12-12 LAB — D-DIMER, QUANTITATIVE: D-Dimer, Quant: 0.43 ug/mL-FEU (ref 0.00–0.50)

## 2019-12-12 LAB — LIPASE, BLOOD: Lipase: 35 U/L (ref 11–51)

## 2019-12-12 MED ORDER — KETOROLAC TROMETHAMINE 30 MG/ML IJ SOLN
30.0000 mg | Freq: Once | INTRAMUSCULAR | Status: AC
  Administered 2019-12-12: 04:00:00 30 mg via INTRAVENOUS
  Filled 2019-12-12: qty 1

## 2019-12-12 MED ORDER — NAPROXEN 500 MG PO TABS: 500.0000 mg | ORAL_TABLET | Freq: Two times a day (BID) | ORAL | 0 refills | Status: AC

## 2019-12-12 NOTE — ED Notes (Signed)
Pt transported to US

## 2019-12-12 NOTE — ED Notes (Signed)
Pt d/c'ed by provider. Ambulatory with steady gait.

## 2019-12-12 NOTE — ED Provider Notes (Signed)
TIME SEEN: 3:20 AM  CHIEF COMPLAINT: right flank and RUQ pain  HPI: Patient is a 49 year old male with history of alcohol abuse who presents to the emergency department with sharp right flank pain and right upper quadrant abdominal pain for 3 days.  No injury to the back.  Pain worse with deep inspiration.  Denies fevers, cough, chest pain, vomiting, diarrhea, dysuria, hematuria.  No history of kidney stones, kidney infection.  No history of PE, DVT, exogenous estrogen use, recent fractures, surgery, trauma, hospitalization or prolonged travel. No lower extremity swelling or pain. No calf tenderness.  Patient reports he still has his gallbladder.  ROS: See HPI Constitutional: no fever  Eyes: no drainage  ENT: no runny nose   Cardiovascular:  no chest pain  Resp:  SOB  GI: no vomiting GU: no dysuria Integumentary: no rash  Allergy: no hives  Musculoskeletal: no leg swelling  Neurological: no slurred speech ROS otherwise negative  PAST MEDICAL HISTORY/PAST SURGICAL HISTORY:  Past Medical History:  Diagnosis Date   Alcoholism (HCC)     MEDICATIONS:  Prior to Admission medications   Not on File    ALLERGIES:  No Known Allergies  SOCIAL HISTORY:  Social History   Tobacco Use   Smoking status: Current Every Day Smoker    Packs/day: 0.00    Types: Cigarettes   Smokeless tobacco: Never Used  Substance Use Topics   Alcohol use: Yes    FAMILY HISTORY: Family History  Problem Relation Age of Onset   Hypertension Mother     EXAM: BP 129/75    Pulse 60    Temp 98.6 F (37 C) (Oral)    Resp 16    Ht 5\' 10"  (1.778 m)    Wt 79.4 kg    SpO2 97%    BMI 25.11 kg/m  CONSTITUTIONAL: Alert and oriented and responds appropriately to questions. Well-appearing; well-nourished HEAD: Normocephalic EYES: Conjunctivae clear, pupils appear equal, EOM appear intact ENT: normal nose; moist mucous membranes NECK: Supple, normal ROM CARD: RRR; S1 and S2 appreciated; no murmurs, no  clicks, no rubs, no gallops CHEST:  Chest wall is nontender to palpation.  No crepitus, ecchymosis, erythema, warmth, rash or other lesions present.   RESP: Normal chest excursion without splinting or tachypnea; breath sounds clear and equal bilaterally; no wheezes, no rhonchi, no rales, no hypoxia or respiratory distress, speaking full sentences ABD/GI: Normal bowel sounds; non-distended; soft, tender to palpation in the right upper quadrant with voluntary guarding and positive Murphy sign, no rebound tenderness BACK:  The back appears normal, minimal tenderness over the thoracic paraspinal muscles on the right side, no midline spinal tenderness or step-off or deformity, no redness or warmth, no ecchymosis or soft tissue swelling, no rash or other lesions EXT: Normal ROM in all joints; no deformity noted, no edema; no cyanosis SKIN: Normal color for age and race; warm; no rash on exposed skin NEURO: Moves all extremities equally, normal sensation diffusely, normal gait PSYCH: The patient's mood and manner are appropriate.   MEDICAL DECISION MAKING: Patient here with sudden onset right flank pain for the past several days.  No injury.  He is minimally tender over the paraspinal muscles.  He reports pain is pleuritic in nature.  He has no risk factors for PE but does appear to be splinting intermittently on exam.  Will obtain D-dimer.  This seems atypical for ACS and he has had one negative troponin.  EKG shows no new ischemic change.  Chest x-ray  clear.  No sign of pneumonia or rib fracture.  We will add on urinalysis to evaluate for blood or infection.  He is actually quite tender in the right upper quadrant making me think this could be from his gallbladder.  He also does have a history of pancreatitis.  We will add on LFTs, lipase and right upper quadrant abdominal ultrasound.  Will give Toradol for discomfort.  ED PROGRESS: Patient's urine shows no blood or sign of infection.  D-dimer negative.  He  has some very mildly elevated liver function tests consistent with history of alcohol abuse.  Lipase today normal.  Right upper quadrant ultrasound shows fatty liver but otherwise unremarkable.  Gallbladder appears normal.  Patient resting comfortably and states feeling better after Toradol.  Suspect musculoskeletal pain.  Given his history of alcohol abuse, will avoid sedative medication such as muscle relaxers, narcotics at this time and discharged with anti-inflammatories.  Recommended follow-up with PCP and will provide outpatient follow-up information.  He is comfortable with this plan.   At this time, I do not feel there is any life-threatening condition present. I have reviewed, interpreted and discussed all results (EKG, imaging, lab, urine as appropriate) and exam findings with patient/family. I have reviewed nursing notes and appropriate previous records.  I feel the patient is safe to be discharged home without further emergent workup and can continue workup as an outpatient as needed. Discussed usual and customary return precautions. Patient/family verbalize understanding and are comfortable with this plan.  Outpatient follow-up has been provided as needed. All questions have been answered.    EKG Interpretation  Date/Time:  Tuesday December 11 2019 13:10:32 EDT Ventricular Rate:  62 PR Interval:  168 QRS Duration: 92 QT Interval:  412 QTC Calculation: 418 R Axis:   76 Text Interpretation: Normal sinus rhythm No old tracing to compare Confirmed by Dione Booze ([id number]) on 12/12/2019 12:12:55 AM         Kevin Giles was evaluated in Emergency Department on 12/12/2019 for the symptoms described in the history of present illness. He was evaluated in the context of the global COVID-19 pandemic, which necessitated consideration that the patient might be at risk for infection with the SARS-CoV-2 virus that causes COVID-19. Institutional protocols and algorithms that pertain to the evaluation  of patients at risk for COVID-19 are in a state of rapid change based on information released by regulatory bodies including the CDC and federal and state organizations. These policies and algorithms were followed during the patient's care in the ED.      Mckenzee Beem, Layla Maw, DO 12/12/19 [phone]

## 2019-12-12 NOTE — Discharge Instructions (Signed)
Your labs showed no sign of heart attack or blood clot.  Your chest x-ray was clear and showed no pneumonia, heart failure or rib fracture.  Ultrasound of the gallbladder was also normal but did show signs of a fatty liver.  Recommend a low-fat diet and avoiding alcohol.  Your liver function tests were also minimally elevated likely secondary to history of heavy alcohol use.  Your urine showed no sign of infection or blood to suggest UTI, kidney infection or kidney stone.  I suspect that your pain in your back today is musculoskeletal in nature.  You may use anti-inflammatories such as naproxen to help with discomfort.  I recommend that you follow-up with a primary care provider if symptoms do not improve.  Steps to find a Primary Care Provider (PCP):  Call [phone] or [phone] to access "[facility] Find a Doctor Service."  2.  You may also go on the [facility] website at [email or web address]  3.  [facility] also frequently accepts new patients.  [facility]  [address] [place] [address] [phone]  4.  There are also multiple [facility], [facility] and [facility] practices throughout the [place] that are frequently accepting new patients. You may find a clinic that is close to your home and contact them.  [email or web address] [phone]  [facility] [email or web address]  [email or web address] [phone]  [facility] [email or web address] [phone]  5.  Local Health Departments also can provide primary care services.  [facility]  [address] [place] [address] [phone]  [facility] Department [address] [place] [address] [phone]  [facility] [address] [place] [address]  [place] [address] [phone]

## 2022-03-11 ENCOUNTER — Emergency Department (HOSPITAL_COMMUNITY)
Admission: EM | Admit: 2022-03-11 | Discharge: 2022-03-11 | Disposition: A | Discharge status: Home / Self Care | Payer: BLUE CROSS/BLUE SHIELD | Attending: Emergency Medicine | Admitting: Emergency Medicine

## 2022-03-11 ENCOUNTER — Other Ambulatory Visit: Payer: Self-pay

## 2022-03-11 ENCOUNTER — Encounter (HOSPITAL_COMMUNITY): Payer: Self-pay | Admitting: *Deleted

## 2022-03-11 DIAGNOSIS — Y92410 Unspecified street and highway as the place of occurrence of the external cause: Secondary | ICD-10-CM | POA: Diagnosis not present

## 2022-03-11 DIAGNOSIS — M545 Low back pain, unspecified: Secondary | ICD-10-CM | POA: Insufficient documentation

## 2022-03-11 DIAGNOSIS — M25512 Pain in left shoulder: Secondary | ICD-10-CM | POA: Insufficient documentation

## 2022-03-11 MED ORDER — CYCLOBENZAPRINE HCL 10 MG PO TABS: 10.0000 mg | ORAL_TABLET | Freq: Two times a day (BID) | ORAL | 0 refills | Status: AC | PRN

## 2022-03-11 MED ORDER — IBUPROFEN 600 MG PO TABS: 600.0000 mg | ORAL_TABLET | Freq: Four times a day (QID) | ORAL | 0 refills | Status: AC | PRN
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
